# Patient Record
Sex: Male | Born: 2000 | Race: White | Hispanic: No | Marital: Single | Smoking: Never smoker
Health system: Southern US, Community
[De-identification: ages and names within clinical notes are randomized; demographics above are authoritative.]

## PROBLEM LIST (undated history)

## (undated) DIAGNOSIS — F419 Anxiety disorder, unspecified: Secondary | ICD-10-CM

## (undated) DIAGNOSIS — R1084 Generalized abdominal pain: Secondary | ICD-10-CM

## (undated) DIAGNOSIS — R51 Headache: Secondary | ICD-10-CM

## (undated) DIAGNOSIS — F909 Attention-deficit hyperactivity disorder, unspecified type: Secondary | ICD-10-CM

## (undated) DIAGNOSIS — K5900 Constipation, unspecified: Secondary | ICD-10-CM

## (undated) HISTORY — DX: Headache: R51

## (undated) HISTORY — DX: Constipation, unspecified: K59.00

## (undated) HISTORY — DX: Generalized abdominal pain: R10.84

## (undated) HISTORY — DX: Anxiety disorder, unspecified: F41.9

---
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
## 2023-05-01 ENCOUNTER — Ambulatory Visit: Payer: Self-pay | Admitting: Urology
